# Patient Record
Sex: Male | Born: 1960 | Race: White | Hispanic: No | Marital: Married | Smoking: Never smoker
Health system: Southern US, Community
[De-identification: ages and names within clinical notes are randomized; demographics above are authoritative.]

## PROBLEM LIST (undated history)

## (undated) DIAGNOSIS — E119 Type 2 diabetes mellitus without complications: Secondary | ICD-10-CM

## (undated) DIAGNOSIS — I10 Essential (primary) hypertension: Secondary | ICD-10-CM

## (undated) DIAGNOSIS — I2510 Atherosclerotic heart disease of native coronary artery without angina pectoris: Secondary | ICD-10-CM

## (undated) HISTORY — PX: SPINAL FUSION: SHX223

## (undated) HISTORY — PX: CORONARY ARTERY BYPASS GRAFT: SHX141

---

## 2008-02-08 ENCOUNTER — Encounter: Admission: RE | Admit: 2008-02-08 | Discharge: 2008-02-08 | Payer: Self-pay | Admitting: Orthopedic Surgery

## 2008-03-17 ENCOUNTER — Ambulatory Visit (HOSPITAL_COMMUNITY): Admission: RE | Admit: 2008-03-17 | Discharge: 2008-03-18 | Payer: Self-pay | Admitting: Orthopedic Surgery

## 2008-07-27 ENCOUNTER — Inpatient Hospital Stay (HOSPITAL_COMMUNITY): Admission: RE | Admit: 2008-07-27 | Discharge: 2008-08-01 | Payer: Self-pay | Admitting: Orthopedic Surgery

## 2008-07-28 ENCOUNTER — Encounter (INDEPENDENT_AMBULATORY_CARE_PROVIDER_SITE_OTHER): Payer: Self-pay | Admitting: Orthopedic Surgery

## 2009-10-30 IMAGING — CR DG OR PORTABLE SPINE
1 series · 1 of 1 positions shown · non-contrast
Comparison: Preoperative lumbar radiographs 07/20/2008 and earlier.

CLINICAL DATA: 48-year-old male undergoing lumbar spine surgery.

PORTABLE SPINE

[view not recorded]
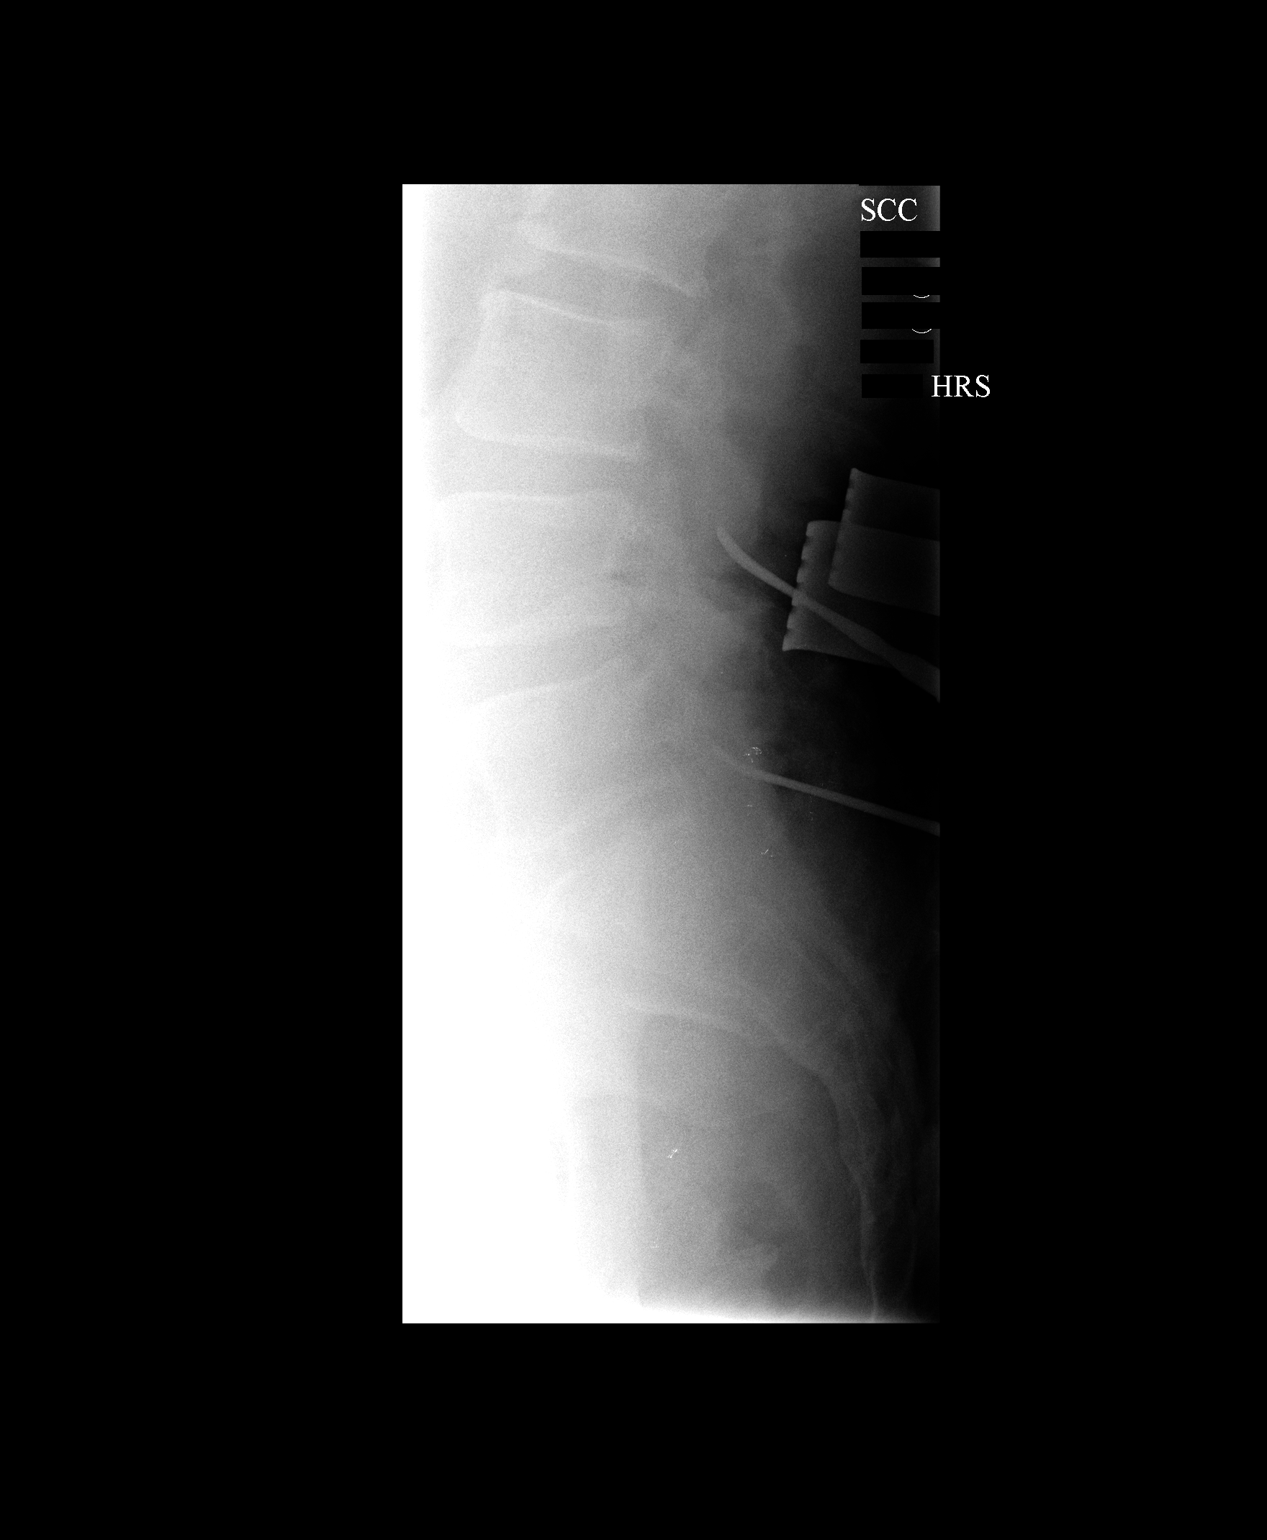

[1 of 1 positions shown; findings below may reference images not displayed]

FINDINGS: Portable cross-table lateral intraoperative view of the
lumbar spine labeled #1 at 8726 hours.

Using the same numbering scheme as on the preoperative exam, two
surgical markers are in place and the more caudal projects at the
L5-S1 disc space level while the more cephalad projects at the L3-
L4 interspace.
IMPRESSION: Intraoperative localization as above.

## 2010-01-15 ENCOUNTER — Ambulatory Visit: Payer: Self-pay | Admitting: Diagnostic Radiology

## 2010-01-15 ENCOUNTER — Emergency Department (HOSPITAL_BASED_OUTPATIENT_CLINIC_OR_DEPARTMENT_OTHER): Admission: EM | Admit: 2010-01-15 | Discharge: 2010-01-15 | Payer: Self-pay | Admitting: Emergency Medicine

## 2010-07-18 LAB — GLUCOSE, CAPILLARY
Glucose-Capillary: 109 mg/dL — ABNORMAL HIGH (ref 70–99)
Glucose-Capillary: 126 mg/dL — ABNORMAL HIGH (ref 70–99)
Glucose-Capillary: 126 mg/dL — ABNORMAL HIGH (ref 70–99)
Glucose-Capillary: 149 mg/dL — ABNORMAL HIGH (ref 70–99)
Glucose-Capillary: 166 mg/dL — ABNORMAL HIGH (ref 70–99)
Glucose-Capillary: 168 mg/dL — ABNORMAL HIGH (ref 70–99)
Glucose-Capillary: 173 mg/dL — ABNORMAL HIGH (ref 70–99)
Glucose-Capillary: 185 mg/dL — ABNORMAL HIGH (ref 70–99)
Glucose-Capillary: 193 mg/dL — ABNORMAL HIGH (ref 70–99)
Glucose-Capillary: 194 mg/dL — ABNORMAL HIGH (ref 70–99)

## 2010-07-18 LAB — CBC
HCT: 32.4 % — ABNORMAL LOW (ref 39.0–52.0)
HCT: 34.2 % — ABNORMAL LOW (ref 39.0–52.0)
HCT: 47.3 % (ref 39.0–52.0)
MCHC: 34.3 g/dL (ref 30.0–36.0)
MCHC: 32.6 g/dL (ref 30.0–36.0)
MCV: 86.6 fL (ref 78.0–100.0)
MCV: 86.7 fL (ref 78.0–100.0)
MCV: 87.3 fL (ref 78.0–100.0)
MCV: 87.7 fL (ref 78.0–100.0)
Platelets: 224 10*3/uL (ref 150–400)
Platelets: 239 10*3/uL (ref 150–400)
Platelets: 242 10*3/uL (ref 150–400)
Platelets: 318 10*3/uL (ref 150–400)
RDW: 13.4 % (ref 11.5–15.5)
RDW: 13.8 % (ref 11.5–15.5)
RDW: 13.6 % (ref 11.5–15.5)
WBC: 10.1 10*3/uL (ref 4.0–10.5)
WBC: 12.3 10*3/uL — ABNORMAL HIGH (ref 4.0–10.5)

## 2010-07-18 LAB — LIPID PANEL
HDL: 29 mg/dL — ABNORMAL LOW (ref >39)
Triglycerides: 103 mg/dL (ref <150)

## 2010-07-18 LAB — COMPREHENSIVE METABOLIC PANEL
AST: 36 U/L (ref 0–37)
BUN: 11 mg/dL (ref 6–23)
CO2: 26 mEq/L (ref 19–32)
Chloride: 102 mEq/L (ref 96–112)
Creatinine, Ser: 0.91 mg/dL (ref 0.4–1.5)
GFR calc non Af Amer: >60 mL/min (ref >60)
Glucose, Bld: 194 mg/dL — ABNORMAL HIGH (ref 70–99)
Total Bilirubin: 0.8 mg/dL (ref 0.3–1.2)

## 2010-07-18 LAB — CARDIAC PANEL(CRET KIN+CKTOT+MB+TROPI)
Total CK: 618 U/L — ABNORMAL HIGH (ref 7–232)
Troponin I: 0.02 ng/mL (ref 0.00–0.06)

## 2010-07-18 LAB — TSH: TSH: 0.609 u[IU]/mL (ref 0.350–4.500)

## 2010-07-18 LAB — BASIC METABOLIC PANEL
BUN: 8 mg/dL (ref 6–23)
BUN: 8 mg/dL (ref 6–23)
BUN: 7 mg/dL (ref 6–23)
CO2: 29 mEq/L (ref 19–32)
Calcium: 8.1 mg/dL — ABNORMAL LOW (ref 8.4–10.5)
Chloride: 100 mEq/L (ref 96–112)
Chloride: 103 mEq/L (ref 96–112)
Chloride: 98 mEq/L (ref 96–112)
Chloride: 100 mEq/L (ref 96–112)
Creatinine, Ser: 0.87 mg/dL (ref 0.4–1.5)
GFR calc Af Amer: >60 mL/min (ref >60)
Glucose, Bld: 160 mg/dL — ABNORMAL HIGH (ref 70–99)
Glucose, Bld: 196 mg/dL — ABNORMAL HIGH (ref 70–99)
Potassium: 3.4 mEq/L — ABNORMAL LOW (ref 3.5–5.1)
Potassium: 3.7 mEq/L (ref 3.5–5.1)
Potassium: 4 mEq/L (ref 3.5–5.1)

## 2010-07-18 LAB — POCT I-STAT 4, (NA,K, GLUC, HGB,HCT)
Glucose, Bld: 163 mg/dL — ABNORMAL HIGH (ref 70–99)
HCT: 35 % — ABNORMAL LOW (ref 39.0–52.0)
Hemoglobin: 11.9 g/dL — ABNORMAL LOW (ref 13.0–17.0)
Sodium: 137 mEq/L (ref 135–145)

## 2010-07-18 LAB — TROPONIN I: Troponin I: <0.01 ng/mL (ref 0.00–0.06)

## 2010-07-18 LAB — MAGNESIUM: Magnesium: 1.5 mg/dL (ref 1.5–2.5)

## 2010-08-21 NOTE — Op Note (Signed)
[name], [name]              ACCOUNT NO.:  [id number]   MEDICAL RECORD NO.:  [id number]          PATIENT TYPE:  AMB   LOCATION:  SDS                          FACILITY:  [facility]   PHYSICIAN:  Vania Rea. Supple, M.D.  DATE OF BIRTH:  04/24/1960   DATE OF PROCEDURE:  03/17/2008  DATE OF DISCHARGE:                               OPERATIVE REPORT   PREOPERATIVE DIAGNOSIS:  Right knee medial meniscus tear.   POSTOPERATIVE DIAGNOSIS:  Right knee chondromalacia and extensive  synovitis.   PROCEDURES:  1. Right knee diagnostic arthroscopy.  2. Extensive synovectomy.  3. Chondroplasty of the medial femoral condyle.   SURGEON:  Vania Rea. Supple, MD   ASSISTANT:  Lucita Lora. Shuford PA-C   ANESTHESIA:  General endotracheal as well as local anesthetic.   TOURNIQUET TIME:  None was used.   ESTIMATED BLOOD LOSS:  Minimal.   DRAINS:  None.   HISTORY:  [name] is a 50 year old gentleman who has had persistent  right knee pain and symptoms that have been refractory to prolonged  attempts at conservative management.  Due to his ongoing pain and  functional limitations, he is brought to the operating room at this time  for planned right knee arthroscopy as described below.   Preoperatively, I counseled [name] on treatment options as well as  risks versus benefits thereof.  Possible surgical complications of  bleeding, infection, neurovascular injury, DVT, PE, persistent pain, and  possible need for additional surgery were reviewed.  He understands and  accepts and agrees with our planned procedure.   PROCEDURE IN DETAIL:  After undergoing routine preop evaluation, the  patient received prophylactic antibiotics.  Brought to the operating  room, placed supine on the operating table, and underwent induction of  general endotracheal anesthesia.  Of note, he did have a difficult  airway.  GlideScope was utilized.  Right lower extremity was placed in  a leg holder and sterilely prepped and  draped in standard fashion.  Standard arthroscopy portals were established and diagnostic arthroscopy  was performed.  The suprapatellar pouch was clear as were the gutters.  There was significant proliferative overgrowth of the anterior chamber  synovial tissues encroaching upon the patellofemoral joint and anterior  chamber and extensive synovectomy was performed.  There was a nodule of  scarified tissue in the intercondylar notch which was pedunculated and  this was also removed with a shaver.  I suspect this was a remnant of  the ligamentum mucosum.  The ACL was intact.  The patellofemoral joint  showed normal patellar tracking and overall normal articular cartilage  with some softening of the superficial layers noted upon probing.  In  the medial compartment, there were several areas of blistering of the  superficial chondral layers and on probing, these were actually  significant chondral defects which were debrided and consistent with  grade 1 chondromalacia.  We then carefully inspected and probed the  medial meniscus and found it to be stable and did not identify any  obvious displacement tears.  In the lateral compartment, the articular  surfaces appeared to be in overall  good condition and the meniscus was  carefully probed and stable and found to be intact.  At this point, we  once again reevaluated the medial compartment, the medial meniscus, and  the additional intra-articular structures and did not identify any  additional articular pathologies.  Fluid and instruments were then  removed.  A combination of Marcaine, morphine, epinephrine, and  clonidine was instilled into the knee joint.  Additional Marcaine with  epinephrine instilled about the portals.  The portals were closed with  Steri-Strips.  A bulky dry dressing was then taped about the right knee  and the right leg was wrapped in Ace bandages, and thigh-high support  stocking.  The patient was then awakened,  extubated, and taken to the  recovery room in stable condition.      Vania Rea. Supple, M.D.  Electronically Signed     KMS/MEDQ  D:  03/17/2008  T:  03/18/2008  Job:  [id number]

## 2010-08-21 NOTE — Consult Note (Signed)
[name], [name]              ACCOUNT NO.:  [id number]   MEDICAL RECORD NO.:  [id number]          PATIENT TYPE:  INP   LOCATION:  5502                         FACILITY:  [facility]   PHYSICIAN:  Theodosia Paling, MD    DATE OF BIRTH:  [birth year]   DATE OF CONSULTATION:  07/29/2008  DATE OF DISCHARGE:                                 CONSULTATION   REASON FOR CONSULTATION:  Management of diabetes.   ADMISSION HISTORY:  [name] is a pleasant 50 year-old  gentleman who was admitted on July 27, 2008 for lumbar spine  decompression.  Meanwhile he developed high degree heart block and  cardiology consult was carried out.  He is noticed to have hyperglycemia  through this admission.  His home insulin regimen has not been with him  during  inpatient.  [facility] hospitalist is contacted for further  evaluation and management.   REVIEW OF SYSTEMS:  Essentially negative.  The patient feels he may have  some low grade fever.  Other than that, essentially negative.   PAST MEDICAL HISTORY:  1. Diabetes mellitus.  2. Obstructive sleep apnea with CPAP.  3. Impaired hearing with tinnitus.  4. Hyperlipidemia.  5. GERD.  6. Type 2 diabetes mellitus.  7. Kidney stones.  8. Hypertension.  9. High degree heart block discovered on this admission.   MEDICATIONS:  1. Pravastatin 40 mg p.o. daily.  2. Metformin 1000 mg p.o. q.12h.  3. Hydrochlorothiazide.  4. Hydrocodone/ acetaminophen 5/325 mg p.o. q.6h p.r.n.  5. Levemir 100 150 units subcu q.12h.  6. Diovan HCTZ 160 / 25 mg p.o. daily.  7. Aspirin 81 mg p.o. daily.   ALLERGIES:  QUESTIONABLE ALLERGY TO PENICILLIN.   SOCIAL HISTORY:  He lives with wife.  He denies  tobacco, alcohol and IV  drug abuse.   FAMILY HISTORY:  Mother had diabetes.  Father has diabetes.  CABG in his  1s.  His siblings are healthy.   CURRENT INPATIENT MEDICATIONS:  1. Aspirin enteric coated 81 mg p.o. daily.  2. Dulcolax 10 mg p.o. q.h.s. p.r.n.  3.  Colace 100 mg p.o. q. 12 h.  4. Sliding scale insulin.  5. Tylenol 650 mg p.o. q.4-6 h p.r.n..  6. Zofran  IV q.4h p.r.n.  7. Robaxin 500 mg p.o. q.8h p.r.n..  8. Percocet one to two tablets p.o. q.6h p.r.n..  9. Mechanical DVT prophylaxis.   PHYSICAL EXAMINATION:  VITAL SIGNS:  Blood pressure 132/75, temperature  100.5, heart rate 104, respiratory rate 22, O2 saturation 95% on room  air.  GENERAL:  In no acute cardiorespiratory distress.  LUNGS:  Normal breath sounds.  No rhonchi or crepitations.  CARDIOVASCULAR:  S1 and S2 normal.  No murmurs or rubs heard.  GASTROINTESTINAL:  Soft, nontender, obese.  EXTREMITIES:  No pedal edema.  NEUROLOGIC:  Speech intact.  Follows commands.  PSYCHIATRIC:  Oriented x 3.   LAB DATA:  No lab data is obtained today.  We have lab from yesterday  which is WBC 12.3, hemoglobin 11.3, hematocrit 32.7, platelets 224,  sodium 136, potassium 4.5, chloride 103, bicarb 27, BUN  13, creatinine  0.87.  platelet count 202.  Fasting blood glucose 173.   ASSESSMENT AND PLAN:  1. Diabetes, hyperglycemia.  The patient will need longacting insulin      to obviously cover fasting hypoglycemia. I am not sure about the      high dose of Levemir he was taking.  At this time, his fasting      hypoglycemia is not extremely severe. I would cautiously start him      on Lantus 25 units subcu while he is on carb modified diet along      with Humulin insulin which will be at least 8 units before each      meal.  Would titrate up the insulin as required and resume      metformin at this time.  He does not have renal insufficiency or      possible catheterization coming up.  2. Fever.  The patient has low grade fever at this time.  I would not      do anything however if he has persistent fever, a chest x-ray and      urinalysis can be performed to look for the cause of the fever and      possible site would be the recent surgical site to insure there is      no abscess  forming or site of infection at the surgical site.  3. Prophylaxis.  Given his back surgery, heparin and Lovenox are      contraindicated.  Continue SCDs and TEDs.  I would add Protonix for      GI prophylaxis.  4. Code status:  The patient is full code.   Thank you for this consultation.      Theodosia Paling, MD  Electronically Signed     NP/MEDQ  D:  07/29/2008  T:  07/29/2008  Job:  [id number]

## 2010-08-21 NOTE — Op Note (Signed)
NAMETREVIAN, HAYASHIDA              ACCOUNT NO.:  [id number]   MEDICAL RECORD NO.:  [id number]          PATIENT TYPE:  INP   LOCATION:  3310                         FACILITY:  [facility]   PHYSICIAN:  Alvy Beal, MD    DATE OF BIRTH:  July 28, 1960   DATE OF PROCEDURE:  07/27/2008  DATE OF DISCHARGE:                               OPERATIVE REPORT   PREOPERATIVE DIAGNOSIS:  Lumbar spinal stenosis.   POSTOPERATIVE DIAGNOSIS:  Lumbar spinal stenosis.   SURGEON:  Dahari D. Shon Baton, MD   FIRST ASSISTANT:  Crissie Reese, PA   OPERATIVE PROCEDURE:  Posterior lumbar decompression L3-L5 with L3-L4  and L4-L5 arthrodesis.   COMPLICATIONS:  None.   CONDITION:  Stable.   HISTORY:  This is a very pleasant 50 year old gentleman with multiple  medical comorbidities who presents with a history of lumbar spinal  stenosis with neurogenic claudication.  Attempts at conservative  management have failed to alleviate his pain.  These included injection  therapy, physiotherapy, narcotic and nonnarcotic medications, and  activity modification.  After failing conservative management, we  discussed surgical intervention.  All appropriate risks, benefits, and  alternatives were discussed with the patient and consent was obtained.   OPERATIVE NOTE:  The patient was brought to the operating room and  placed supine on the operating table.  After successful induction of  general anesthesia and endotracheal intubation, TEDs, SCDs, and Foley  were applied.  He was then turned prone onto the [place] frame.  Because  of the previous right rotator cuff surgery, his right shoulder was very  stiff and so we elected to keep this at the patient's side.  The left  was placed overhead and well-padded.  All bony prominences were well-  padded.  Then the back was prepped and draped in standard fashion.  A  midline incision was taken and a sharp dissection was carried out down  to deep fascia.  The deep fascia was  sharply incised and exposed from  the superior portion of L3 to the superior aspect of S1.  Once I was  through this deep fascia, I used a Cobb elevator to strip the muscles  off the erector spinae muscles to expose the lamina and spinous process  of L3, L4, and L5.  I then exposed the L3-L4 and L4-L5 facet complexes  but care was taken to maintain the integrity of the facet capsule.   Once I had bilateral exposure, I placed a Penfield 4 underneath the L5  lamina and a Therapist, nutritional underneath the L3 lamina.  I then took an  intraoperative x-ray.  I confirmed the appropriate level.  Once  confirmed, I then began my decompression.  I then developed a plane  underneath the L5 lamina and using a combination of 3 and 4-mm  Kerrisons, I removed the entire lamina of L5.  I then developed a plane  between the ligamentum flavum and the underlying thecal sac.  I then  placed a neural patty in this layer to protect the thecal sac and then  using 2 and 3-mm Kerrison resected the entire ligamentum flavum  and  completed the L5 laminectomy.  I then proceeded in a similar fashion  performing central decompression until I was about half way and  performed a laminotomy in the inferior half of L3.  With the central  decompression complete from L3 to L5, I then proceeded into the lateral  gutter.   I resected the very thickened ligamentum flavum and significant facet  bone spurs to expose the lateral recess.  I decompressed all the way out  to the medial wall of the pedicle.  At this point, once I could see the  pedicle, I was able to visualize the exiting nerve root and was able to  perform a foraminotomy.  I then exposed the L5 pedicle and performed a  foraminotomy at that level as well.   At this point in time, I had an adequate lateral decompression from L3-  L5 as well as a central decompression.  I then went to the opposite side  of the table and then worked on the lateral gutter again using 2 and  3-  mm Kerrison in order to effect an adequate lateral recess decompression  and foraminotomy.   At this point in time, because of the excessive amount of bone, I took  in order to adequately decompress the spine.  I elected to proceed with  a uninstrumented arthrodesis.  At this point, I disrupted the L4-L5  facet complex and exposed the L3 transverse process, L4 transverse  process, and L5 transverse process.  I kept the L5-S1 facet complex and  the L2-L3 facet complex intact.  I then decorticated the transverse  process and lateral aspect of the facet complex from L3 down to L5 and  then packed the posterolateral gutter with bone that I harvested in the  deep fascia with interrupted #1 Vicryl sutures and then did a zero  running layer stitch and then a 2-0 interrupted subcutaneous and a 3-0  Monocryl for the skin.  Steri-Strips and dry dressing was applied and  the patient was extubated and transferred to the PACU without incident.  At the end of the case, all needle and sponge counts were correct.  There were no adverse intraoperative events.      Alvy Beal, MD  Electronically Signed     DDB/MEDQ  D:  07/27/2008  T:  07/28/2008  Job:  [id number]

## 2010-08-24 NOTE — Discharge Summary (Signed)
NAMEDONZELL, COLLER              ACCOUNT NO.:  [id number]   MEDICAL RECORD NO.:  [id number]          PATIENT TYPE:  INP   LOCATION:  5502                         FACILITY:  [facility]   PHYSICIAN:  Alvy Beal, MD    DATE OF BIRTH:  08-28-1960   DATE OF ADMISSION:  07/27/2008  DATE OF DISCHARGE:  08/01/2008                               DISCHARGE SUMMARY   ADMISSION DIAGNOSIS:  Lumbar spinal stenosis.   DISCHARGE DIAGNOSIS:  Lumbar spinal stenosis with atrioventricular  block, complete.   CONSULTATIONS OBTAINED:  1. Cardiology.  2. Internal Medicine.   PROCEDURE:  Posterior lumbar decompression, L3 through L5 with L3  through L5 uninstrumented arthrodesis.   BRIEF HISTORY:  Mr. Renteria is a very pleasant 50 year old gentleman with  multiple medical comorbidities, who presented to Dr. Shon Baton with a  history of a lumbar spinal stenosis with neurogenic claudication.  All  attempts at conservative management consisting of physical therapy,  injection therapy, narcotic and nonnarcotic medications had failed to  alleviate his pain.  After failing conservative management, Dr. Shon Baton  had a lengthy discussion with both the patient and his wife about  surgical intervention.  All appropriate risks, benefits, and  alternatives were discussed with the patient, and a consent was  obtained.   HOSPITAL COURSE:  The patient's hospital course was approximately 5 days  in length.  It was complicated by the fact that during the extubation,  it was noted that the patient had runs of both the second and third-  degree heart block.  Therefore, a Cardiology consultation was obtained,  and also postoperatively day #2, it was noted that the patient's  diabetes was not being well controlled.  Therefore, an Internal Medicine  consultation was obtained to help manage his diabetes while inpatient.  From a VAC standpoint, the patient's hospital course was very well.  Postoperative day #1, he was worked  well with physical therapy.  He  began to have regular bowel and bladder movements.  He was complaining  of good pain control.  His compartments were nontender and he had a good  output from his Hemovac; therefore, it was removed postoperative day #2,  the patient continued to make positive weight gains throughout his  hospital stay.  Again, on postoperative day #2, he was deemed stable to  be transferred from the ICU to telemetry.  Postoperative day #5, the  patient's diabetes was better under control.  His cardiac labs have  remained stable.  He continued to work well with physical therapy.  He  was ambulating greater than 200 feet with the use of a rolling walker.  His incision remained clean, dry, and intact with no evidence of  erythema or wound dehiscence.  Throughout his entire stay, he remained  afebrile throughout his entire stay with his vitals ranging from a T-max  on April 25 at 95 to an eventual temperature of 97.9 upon discharge.  The patient's labs also remained stable throughout his hospital stay.  His compartments were soft and nontender.  He was tolerating regular  diet, having regular bowel or bladder movements.  The patient had some  mild complaints of constipation.  Postoperative day #4, the patient was  cleared by both Internal Medicine and Cardiology for discharge to home.   DISPOSITION:  The patient is discharged to home with home health care.   DISCHARGE MEDICATIONS:  1. Pravastatin 40 mg daily.  2. Metformin 1000 mg p.o. b.i.d.  3. Lantus insulin 25 units subcu at night.  4. Diovan/hydrochlorothiazide 140/25.  5. Aspirin 81 mg daily.  6. Multivitamins daily.  7. Percocet 10/325 one tablet p.o. q.6 h. p.r.n. pain.  8. He is to discontinue the use of his Celebrex.  9. He is to discontinue the use of his hydrocodone.  10.The patient is to discontinue the use of his Levemir.   DISCHARGE INSTRUCTIONS:  The patient is instructed to follow up with Dr.  Shon Baton in  the office at 2 weeks after his surgical date for a wound  check and suture removal.  He was given a preprinted lumbar  decompression instructions that went over specifics on ambulation, wound  management, and when to call the office.  For example, the patient is to  walk every day.  He is not to lift anything over 6 pounds.  He can  resume light household activities in approximately 6 weeks.  In the  meantime, he is not to drive or engage in any other heavy household  activities.  He is allowed to shower on postoperative day #5.  He is to  call the office at [phone] if he notes any fevers or chills or redness  around the area, loss of bowel or bladder function or increasing pain.  The patient is also instructed to follow up with Dr. Tresa Endo with  [facility] on Aug 18, 2008, at 11:15.  the  patient is to call and schedule if he has any questions at [phone].  The patient is to re-follow back up with his primary care physician  regarding his diabetes.      Crissie Reese, PA      Alvy Beal, MD  Electronically Signed    AC/MEDQ  D:  10/03/2008  T:  10/04/2008  Job:  [id number]

## 2011-01-11 LAB — URINALYSIS, ROUTINE W REFLEX MICROSCOPIC
Glucose, UA: NEGATIVE mg/dL
Hgb urine dipstick: NEGATIVE
Ketones, ur: NEGATIVE mg/dL
Protein, ur: NEGATIVE mg/dL
pH: 7 (ref 5.0–8.0)

## 2011-01-11 LAB — COMPREHENSIVE METABOLIC PANEL
ALT: 35 U/L (ref 0–53)
AST: 29 U/L (ref 0–37)
Alkaline Phosphatase: 78 U/L (ref 39–117)
CO2: 30 mEq/L (ref 19–32)
Chloride: 102 mEq/L (ref 96–112)
GFR calc Af Amer: >60 mL/min (ref >60)
GFR calc non Af Amer: >60 mL/min (ref >60)
Glucose, Bld: 61 mg/dL — ABNORMAL LOW (ref 70–99)
Potassium: 4.4 mEq/L (ref 3.5–5.1)
Sodium: 140 mEq/L (ref 135–145)
Total Bilirubin: 0.8 mg/dL (ref 0.3–1.2)

## 2011-01-11 LAB — GLUCOSE, CAPILLARY
Glucose-Capillary: 106 mg/dL — ABNORMAL HIGH (ref 70–99)
Glucose-Capillary: 111 mg/dL — ABNORMAL HIGH (ref 70–99)
Glucose-Capillary: 117 mg/dL — ABNORMAL HIGH (ref 70–99)
Glucose-Capillary: 124 mg/dL — ABNORMAL HIGH (ref 70–99)
Glucose-Capillary: 184 mg/dL — ABNORMAL HIGH (ref 70–99)
Glucose-Capillary: 190 mg/dL — ABNORMAL HIGH (ref 70–99)

## 2011-01-11 LAB — CBC
Hemoglobin: 15.2 g/dL (ref 13.0–17.0)
MCHC: 34.2 g/dL (ref 30.0–36.0)
RBC: 5.14 MIL/uL (ref 4.22–5.81)
WBC: 12 10*3/uL — ABNORMAL HIGH (ref 4.0–10.5)

## 2011-01-11 LAB — PROTIME-INR: Prothrombin Time: 13.4 seconds (ref 11.6–15.2)

## 2022-08-02 ENCOUNTER — Emergency Department (HOSPITAL_COMMUNITY)

## 2022-08-02 ENCOUNTER — Encounter (HOSPITAL_COMMUNITY): Payer: Self-pay | Admitting: Emergency Medicine

## 2022-08-02 ENCOUNTER — Emergency Department (HOSPITAL_COMMUNITY)
Admission: EM | Admit: 2022-08-02 | Discharge: 2022-08-02 | Disposition: A | Discharge status: Home / Self Care | Attending: Emergency Medicine | Admitting: Emergency Medicine

## 2022-08-02 ENCOUNTER — Other Ambulatory Visit: Payer: Self-pay

## 2022-08-02 DIAGNOSIS — W1789XA Other fall from one level to another, initial encounter: Secondary | ICD-10-CM | POA: Insufficient documentation

## 2022-08-02 DIAGNOSIS — S0083XA Contusion of other part of head, initial encounter: Secondary | ICD-10-CM | POA: Insufficient documentation

## 2022-08-02 DIAGNOSIS — Y990 Civilian activity done for income or pay: Secondary | ICD-10-CM | POA: Insufficient documentation

## 2022-08-02 DIAGNOSIS — Z951 Presence of aortocoronary bypass graft: Secondary | ICD-10-CM | POA: Diagnosis not present

## 2022-08-02 DIAGNOSIS — I2510 Atherosclerotic heart disease of native coronary artery without angina pectoris: Secondary | ICD-10-CM | POA: Insufficient documentation

## 2022-08-02 DIAGNOSIS — Y9269 Other specified industrial and construction area as the place of occurrence of the external cause: Secondary | ICD-10-CM | POA: Insufficient documentation

## 2022-08-02 DIAGNOSIS — S0990XA Unspecified injury of head, initial encounter: Secondary | ICD-10-CM | POA: Diagnosis present

## 2022-08-02 DIAGNOSIS — I10 Essential (primary) hypertension: Secondary | ICD-10-CM | POA: Insufficient documentation

## 2022-08-02 DIAGNOSIS — Z23 Encounter for immunization: Secondary | ICD-10-CM | POA: Insufficient documentation

## 2022-08-02 DIAGNOSIS — M545 Low back pain, unspecified: Secondary | ICD-10-CM | POA: Insufficient documentation

## 2022-08-02 DIAGNOSIS — E119 Type 2 diabetes mellitus without complications: Secondary | ICD-10-CM | POA: Diagnosis not present

## 2022-08-02 DIAGNOSIS — R109 Unspecified abdominal pain: Secondary | ICD-10-CM | POA: Diagnosis not present

## 2022-08-02 DIAGNOSIS — Z7982 Long term (current) use of aspirin: Secondary | ICD-10-CM | POA: Insufficient documentation

## 2022-08-02 DIAGNOSIS — S060X1A Concussion with loss of consciousness of 30 minutes or less, initial encounter: Secondary | ICD-10-CM | POA: Insufficient documentation

## 2022-08-02 DIAGNOSIS — W19XXXA Unspecified fall, initial encounter: Secondary | ICD-10-CM

## 2022-08-02 HISTORY — DX: Atherosclerotic heart disease of native coronary artery without angina pectoris: I25.10

## 2022-08-02 HISTORY — DX: Essential (primary) hypertension: I10

## 2022-08-02 HISTORY — DX: Type 2 diabetes mellitus without complications: E11.9

## 2022-08-02 LAB — I-STAT CHEM 8, ED
BUN: 15 mg/dL (ref 8–23)
BUN: 15 mg/dL (ref 8–23)
Calcium, Ion: 1 mmol/L — ABNORMAL LOW (ref 1.15–1.40)
Calcium, Ion: 1.02 mmol/L — ABNORMAL LOW (ref 1.15–1.40)
Chloride: 100 mmol/L (ref 98–111)
Chloride: 101 mmol/L (ref 98–111)
Creatinine, Ser: 0.6 mg/dL — ABNORMAL LOW (ref 0.61–1.24)
Creatinine, Ser: 0.6 mg/dL — ABNORMAL LOW (ref 0.61–1.24)
Glucose, Bld: 170 mg/dL — ABNORMAL HIGH (ref 70–99)
Glucose, Bld: 173 mg/dL — ABNORMAL HIGH (ref 70–99)
HCT: 42 % (ref 39.0–52.0)
HCT: 42 % (ref 39.0–52.0)
Hemoglobin: 14.3 g/dL (ref 13.0–17.0)
Hemoglobin: 14.3 g/dL (ref 13.0–17.0)
Potassium: 4.6 mmol/L (ref 3.5–5.1)
Potassium: 4.6 mmol/L (ref 3.5–5.1)
Sodium: 136 mmol/L (ref 135–145)
Sodium: 135 mmol/L (ref 135–145)
TCO2: 28 mmol/L (ref 22–32)
TCO2: 27 mmol/L (ref 22–32)

## 2022-08-02 LAB — COMPREHENSIVE METABOLIC PANEL
ALT: 20 U/L (ref 0–44)
AST: 25 U/L (ref 15–41)
Albumin: 3.7 g/dL (ref 3.5–5.0)
Alkaline Phosphatase: 73 U/L (ref 38–126)
Anion gap: 14 (ref 5–15)
BUN: 13 mg/dL (ref 8–23)
CO2: 24 mmol/L (ref 22–32)
Calcium: 8.9 mg/dL (ref 8.9–10.3)
Chloride: 97 mmol/L — ABNORMAL LOW (ref 98–111)
Creatinine, Ser: 0.73 mg/dL (ref 0.61–1.24)
GFR, Estimated: >60 mL/min (ref >60)
Glucose, Bld: 169 mg/dL — ABNORMAL HIGH (ref 70–99)
Potassium: 4.5 mmol/L (ref 3.5–5.1)
Sodium: 135 mmol/L (ref 135–145)
Total Bilirubin: 0.8 mg/dL (ref 0.3–1.2)
Total Protein: 6.7 g/dL (ref 6.5–8.1)

## 2022-08-02 LAB — CBC
HCT: 41.3 % (ref 39.0–52.0)
Hemoglobin: 14.1 g/dL (ref 13.0–17.0)
MCH: 29.8 pg (ref 26.0–34.0)
MCHC: 34.1 g/dL (ref 30.0–36.0)
MCV: 87.3 fL (ref 80.0–100.0)
Platelets: 226 10*3/uL (ref 150–400)
RBC: 4.73 MIL/uL (ref 4.22–5.81)
RDW: 14.1 % (ref 11.5–15.5)
WBC: 8.8 10*3/uL (ref 4.0–10.5)
nRBC: 0 % (ref 0.0–0.2)

## 2022-08-02 LAB — CBG MONITORING, ED: Glucose-Capillary: 170 mg/dL — ABNORMAL HIGH (ref 70–99)

## 2022-08-02 LAB — PROTIME-INR
INR: 1.2 (ref 0.8–1.2)
Prothrombin Time: 14.7 seconds (ref 11.4–15.2)

## 2022-08-02 LAB — LACTIC ACID, PLASMA: Lactic Acid, Venous: 2.4 mmol/L (ref 0.5–1.9)

## 2022-08-02 LAB — ETHANOL: Alcohol, Ethyl (B): <10 mg/dL (ref <10)

## 2022-08-02 LAB — TROPONIN I (HIGH SENSITIVITY)
Troponin I (High Sensitivity): 3 ng/L (ref <18)
Troponin I (High Sensitivity): 5 ng/L (ref <18)

## 2022-08-02 LAB — SAMPLE TO BLOOD BANK

## 2022-08-02 MED ORDER — IOHEXOL 350 MG/ML SOLN
75.0000 mL | Freq: Once | INTRAVENOUS | Status: AC | PRN
  Administered 2022-08-02: 75 mL via INTRAVENOUS

## 2022-08-02 MED ORDER — MORPHINE SULFATE (PF) 4 MG/ML IV SOLN
4.0000 mg | Freq: Once | INTRAVENOUS | Status: AC
  Administered 2022-08-02: 4 mg via INTRAVENOUS
  Filled 2022-08-02: qty 1

## 2022-08-02 MED ORDER — ACETAMINOPHEN 500 MG PO TABS
1000.0000 mg | ORAL_TABLET | ORAL | Status: AC
  Administered 2022-08-02: 1000 mg via ORAL
  Filled 2022-08-02: qty 2

## 2022-08-02 MED ORDER — ONDANSETRON HCL 4 MG/2ML IJ SOLN
4.0000 mg | Freq: Once | INTRAMUSCULAR | Status: AC
  Administered 2022-08-02: 4 mg via INTRAVENOUS
  Filled 2022-08-02: qty 2

## 2022-08-02 MED ORDER — TETANUS-DIPHTH-ACELL PERTUSSIS 5-2.5-18.5 LF-MCG/0.5 IM SUSY
0.5000 mL | PREFILLED_SYRINGE | Freq: Once | INTRAMUSCULAR | Status: AC
  Administered 2022-08-02: 0.5 mL via INTRAMUSCULAR
  Filled 2022-08-02: qty 0.5

## 2022-08-02 MED ORDER — LIDOCAINE 5 % EX PTCH
1.0000 | MEDICATED_PATCH | CUTANEOUS | Status: DC
  Administered 2022-08-02: 1 via TRANSDERMAL
  Filled 2022-08-02: qty 1

## 2022-08-02 NOTE — ED Triage Notes (Signed)
Trauma Response Nurse Documentation   [name] is a 62 y.o. male arriving to [facility] ED via [facility]  On No antithrombotic. Trauma was activated as a Level 2 by charge based on the following trauma criteria GCS 10-14 associated with trauma or AVPU < A.  Patient cleared for CT by Dr. Eloise Harman. Pt transported to CT with trauma response nurse present to monitor. RN remained with the patient throughout their absence from the department for clinical observation.   GCS 14.  History   Past Medical History:  Diagnosis Date   Coronary artery disease    Diabetes mellitus without complication (HCC)    Hypertension      Past Surgical History:  Procedure Laterality Date   CORONARY ARTERY BYPASS GRAFT     SPINAL FUSION         Initial Focused Assessment (If applicable, or please see trauma documentation):  Airway - clear Breathing - Unlabored Circulation - no external hemorrhage  GCS - 14 initially -- still having repetitive questioning  CT's Completed:   CT Head, CT Maxillofacial, CT C-Spine, CT Chest w/ contrast, and CT abdomen/pelvis w/ contrast   Interventions:  Labs Xrays CT  Pain meds  Plan for disposition:  Discharge home     Event Summary: Pt was on a crane at work , unsure what happened, fell face first onto a pile of lumber, did not try to brace himself. Abrasions to nose/forehead/scattered abrasions to legs, does have repetitive questioning.  MAE x 4, no numbness/tingling to extremities. C/o severe low back pain. Hx of multiple neck and back surgeries. Hx CABG --  Denies any chest pain. Does have LLQ pain, radiates to back with palpation.     Bedside handoff with ED RN Alease Medina  Trauma Response RN  Please call TRN at [phone] for further assistance.

## 2022-08-02 NOTE — Progress Notes (Signed)
Orthopedic Tech Progress Note Patient Details:  [name] Jun 04, 1960 [id number] Level 2 Trauma. Not needed Patient ID: [name], male   DOB: January 15, 1961, 62 y.o.   MRN: [id number]  Lovett Calender 08/02/2022, 8:12 AM

## 2022-08-02 NOTE — ED Provider Notes (Signed)
[name] EMERGENCY DEPARTMENT AT [facility] Provider Note   CSN: [id number] Arrival date & time: 08/02/22  4098     History  Chief Complaint  Patient presents with   [name] is a 62 y.o. male.  62 year old male with history of CAD status post CABG on aspirin, HTN, and DM who presents emergency department after a fall.  Patient was working in a [employer] site when he fell approximately 5 feet into a pile of wood.  Did strike his face and had some bleeding from his nose.  Also complaining of back pain after the fall.  Not on any other blood thinners aside from aspirin.  No vision changes, weakness or numbness of his arms or legs.  Did not recall exactly how he actually fell.  Unsure of last tetanus shot.  Was reportedly confused per EMS.       Home Medications Prior to Admission medications   Not on File      Allergies    Patient has no known allergies.    Review of Systems   Review of Systems  Physical Exam Updated Vital Signs BP 107/62   Pulse 94   Temp (!) 97.3 F (36.3 C) (Oral)   Resp 17   Ht (S) 5\' 11"  (1.803 m)   Wt (S) 97.5 kg   SpO2 98%   BMI 29.99 kg/m  Physical Exam Constitutional:      General: He is not in acute distress.    Appearance: Normal appearance. He is not ill-appearing.  HENT:     Head: Normocephalic.     Comments: Bleeding from nasal bridge.  Blood from bilateral nares.  No apparent nasal septal hematoma.    Right Ear: External ear normal.     Left Ear: External ear normal.     Mouth/Throat:     Mouth: Mucous membranes are moist.     Pharynx: Oropharynx is clear.     Comments: No apparent loose teeth Eyes:     Extraocular Movements: Extraocular movements intact.     Conjunctiva/sclera: Conjunctivae normal.     Pupils: Pupils are equal, round, and reactive to light.     Comments: Pupils 4 mm bilaterally  Neck:     Comments: C-collar in place Cardiovascular:     Rate and Rhythm: Normal rate and  regular rhythm.     Pulses: Normal pulses.     Heart sounds: Normal heart sounds.  Pulmonary:     Effort: Pulmonary effort is normal. No respiratory distress.     Breath sounds: Normal breath sounds.  Abdominal:     General: Abdomen is flat. Bowel sounds are normal. There is no distension.     Palpations: Abdomen is soft. There is no mass.     Tenderness: There is abdominal tenderness (Left mid abdomen). There is no guarding.  Genitourinary:    Comments: Good rectal tone.  Chaperoned by patient's RN Musculoskeletal:        General: No deformity. Normal range of motion.     Comments: Tenderness to palpation of L3 spine without step-offs noted.  Scars from cervical spinal fusion and lumbar spinal fusion  No tenderness to palpation of chest wall.  No bruising noted.  No tenderness to palpation of bilateral clavicles.  No tenderness to palpation, bruising, or deformities noted of bilateral shoulders, elbows, wrists, hips, knees, or ankles.  Neurological:     General: No focal deficit present.     Mental Status: He  is alert and oriented to person, place, and time. Mental status is at baseline.     Cranial Nerves: No cranial nerve deficit.     Sensory: No sensory deficit.     Motor: No weakness.     ED Results / Procedures / Treatments   Labs (all labs ordered are listed, but only abnormal results are displayed) Labs Reviewed  COMPREHENSIVE METABOLIC PANEL - Abnormal; Notable for the following components:      Result Value   Chloride 97 (*)    Glucose, Bld 169 (*)    All other components within normal limits  LACTIC ACID, PLASMA - Abnormal; Notable for the following components:   Lactic Acid, Venous 2.4 (*)    All other components within normal limits  I-STAT CHEM 8, ED - Abnormal; Notable for the following components:   Creatinine, Ser 0.60 (*)    Glucose, Bld 170 (*)    Calcium, Ion 1.00 (*)    All other components within normal limits  CBG MONITORING, ED - Abnormal; Notable  for the following components:   Glucose-Capillary 170 (*)    All other components within normal limits  I-STAT CHEM 8, ED - Abnormal; Notable for the following components:   Creatinine, Ser 0.60 (*)    Glucose, Bld 173 (*)    Calcium, Ion 1.02 (*)    All other components within normal limits  CBC  ETHANOL  PROTIME-INR  SAMPLE TO BLOOD BANK  TROPONIN I (HIGH SENSITIVITY)  TROPONIN I (HIGH SENSITIVITY)    EKG EKG Interpretation  Date/Time:  Friday August 02 2022 08:22:13 EDT Ventricular Rate:  84 PR Interval:  207 QRS Duration: 87 QT Interval:  367 QTC Calculation: 434 R Axis:   -9 Text Interpretation: Sinus rhythm Low voltage, precordial leads Nonspecific T abnrm, anterolateral leads ST elevation, consider inferior injury Confirmed by Vonita Moss [phone]) on 08/02/2022 8:26:39 AM  Radiology CT MAXILLOFACIAL WO CONTRAST  Result Date: 08/02/2022 CLINICAL DATA:  Facial trauma, blunt EXAM: CT MAXILLOFACIAL WITHOUT CONTRAST TECHNIQUE: Multidetector CT imaging of the maxillofacial structures was performed. Multiplanar CT image reconstructions were also generated. RADIATION DOSE REDUCTION: This exam was performed according to the departmental dose-optimization program which includes automated exposure control, adjustment of the mA and/or kV according to patient size and/or use of iterative reconstruction technique. COMPARISON:  None Available. FINDINGS: Osseous: No fracture or mandibular dislocation. No destructive process. Orbits: Negative. No traumatic or inflammatory finding. Sinuses: No air-fluid levels. Rounded soft tissue in the maxillary sinuses bilaterally, likely mucous retention cyst. Mastoid air cells clear. Soft tissues: Soft tissue swelling over the left side of the forehead/supraorbital region. Limited intracranial: See head CT report IMPRESSION: No facial or orbital fracture. Electronically Signed   By: Charlett Nose M.D.   On: 08/02/2022 10:18   CT L-SPINE NO  CHARGE  Result Date: 08/02/2022 CLINICAL DATA:  back pain sp fall EXAM: CT Thoracic and Lumbar spine with contrast TECHNIQUE: Multiplanar CT images of the thoracic and lumbar spine were reconstructed from contemporary CT of the Chest, Abdomen, and Pelvis. RADIATION DOSE REDUCTION: This exam was performed according to the departmental dose-optimization program which includes automated exposure control, adjustment of the mA and/or kV according to patient size and/or use of iterative reconstruction technique. CONTRAST:  No additional contrast COMPARISON:  CT abdomen and pelvis today. FINDINGS: CT THORACIC SPINE FINDINGS Alignment: Normal Vertebrae: No acute fracture or focal pathologic process. Paraspinal and other soft tissues: Negative. Disc levels: Posterior fusion changes in the mid to  lower thoracic spine. No hardware complicating feature. No visible disc herniation. CT LUMBAR SPINE FINDINGS Segmentation: 5 lumbar type vertebrae. Alignment: Normal Vertebrae: No acute fracture or focal pathologic process. Postoperative changes from laminectomy at L4 and L5. Paraspinal and other soft tissues: Negative Disc levels: Flowing anterior and lateral osteophytes. No disc herniation. IMPRESSION: Postoperative and degenerative changes.  No acute bony abnormality. Electronically Signed   By: Charlett Nose M.D.   On: 08/02/2022 10:17   CT T-SPINE NO CHARGE  Result Date: 08/02/2022 CLINICAL DATA:  back pain sp fall EXAM: CT Thoracic and Lumbar spine with contrast TECHNIQUE: Multiplanar CT images of the thoracic and lumbar spine were reconstructed from contemporary CT of the Chest, Abdomen, and Pelvis. RADIATION DOSE REDUCTION: This exam was performed according to the departmental dose-optimization program which includes automated exposure control, adjustment of the mA and/or kV according to patient size and/or use of iterative reconstruction technique. CONTRAST:  No additional contrast COMPARISON:  CT abdomen and pelvis  today. FINDINGS: CT THORACIC SPINE FINDINGS Alignment: Normal Vertebrae: No acute fracture or focal pathologic process. Paraspinal and other soft tissues: Negative. Disc levels: Posterior fusion changes in the mid to lower thoracic spine. No hardware complicating feature. No visible disc herniation. CT LUMBAR SPINE FINDINGS Segmentation: 5 lumbar type vertebrae. Alignment: Normal Vertebrae: No acute fracture or focal pathologic process. Postoperative changes from laminectomy at L4 and L5. Paraspinal and other soft tissues: Negative Disc levels: Flowing anterior and lateral osteophytes. No disc herniation. IMPRESSION: Postoperative and degenerative changes.  No acute bony abnormality. Electronically Signed   By: Charlett Nose M.D.   On: 08/02/2022 10:17   CT CHEST ABDOMEN PELVIS W CONTRAST  Result Date: 08/02/2022 CLINICAL DATA:  Polytrauma, blunt.  Fall EXAM: CT CHEST, ABDOMEN, AND PELVIS WITH CONTRAST TECHNIQUE: Multidetector CT imaging of the chest, abdomen and pelvis was performed following the standard protocol during bolus administration of intravenous contrast. RADIATION DOSE REDUCTION: This exam was performed according to the departmental dose-optimization program which includes automated exposure control, adjustment of the mA and/or kV according to patient size and/or use of iterative reconstruction technique. CONTRAST:  75mL OMNIPAQUE IOHEXOL 350 MG/ML SOLN COMPARISON:  None Available. FINDINGS: CT CHEST FINDINGS Cardiovascular: Heart is normal size. Aorta is normal caliber. Coronary artery and aortic calcifications. Prior CABG. Mediastinum/Nodes: No mediastinal, hilar, or axillary adenopathy. Trachea and esophagus are unremarkable. Thyroid unremarkable. Lungs/Pleura: Lungs are clear. No focal airspace opacities or suspicious nodules. No effusions. No pneumothorax Musculoskeletal: Chest wall soft tissues are unremarkable. No acute bony abnormality. Posterior fusion changes in the mid to lower thoracic  spine. CT ABDOMEN PELVIS FINDINGS Hepatobiliary: No hepatic injury or perihepatic hematoma. Gallbladder is unremarkable. Pancreas: No focal abnormality or ductal dilatation. Spleen: No splenic injury or perisplenic hematoma. Adrenals/Urinary Tract: No adrenal hemorrhage or renal injury identified. Bladder is unremarkable. Stomach/Bowel: Stomach, large and small bowel grossly unremarkable. Vascular/Lymphatic: No evidence of aneurysm or adenopathy. Scattered aortic calcifications. Reproductive: No focal abnormality. Other: No free fluid or free air. Musculoskeletal: Diffuse degenerative changes in the lumbar spine. Postoperative changes in lower lumbar spine. No acute bony abnormality. IMPRESSION: No acute findings or significant traumatic injury in the chest, abdomen or pelvis. Coronary artery disease, aortic atherosclerosis.  Prior CABG. Electronically Signed   By: Charlett Nose M.D.   On: 08/02/2022 10:14   CT CERVICAL SPINE WO CONTRAST  Result Date: 08/02/2022 CLINICAL DATA:  Polytrauma, blunt EXAM: CT CERVICAL SPINE WITHOUT CONTRAST TECHNIQUE: Multidetector CT imaging of the cervical spine was performed  without intravenous contrast. Multiplanar CT image reconstructions were also generated. RADIATION DOSE REDUCTION: This exam was performed according to the departmental dose-optimization program which includes automated exposure control, adjustment of the mA and/or kV according to patient size and/or use of iterative reconstruction technique. COMPARISON:  None Available. FINDINGS: Alignment: Normal Skull base and vertebrae: No acute fracture. No primary bone lesion or focal pathologic process. Soft tissues and spinal canal: No prevertebral fluid or swelling. No visible canal hematoma. Disc levels: Posterior fusion changes from C3-T2. Diffuse degenerative disc disease with disc space narrowing and flowing anterior and posterior osteophytes. Upper chest: No acute findings Other: None IMPRESSION: Postoperative and  degenerative changes.  No acute bony abnormality. Electronically Signed   By: Charlett Nose M.D.   On: 08/02/2022 10:11   CT HEAD WO CONTRAST  Result Date: 08/02/2022 CLINICAL DATA:  Head trauma, moderate to severe. EXAM: CT HEAD WITHOUT CONTRAST TECHNIQUE: Contiguous axial images were obtained from the base of the skull through the vertex without intravenous contrast. RADIATION DOSE REDUCTION: This exam was performed according to the departmental dose-optimization program which includes automated exposure control, adjustment of the mA and/or kV according to patient size and/or use of iterative reconstruction technique. COMPARISON:  None Available. FINDINGS: Brain: No acute intracranial abnormality. Specifically, no hemorrhage, hydrocephalus, mass lesion, acute infarction, or significant intracranial injury. Vascular: No hyperdense vessel or unexpected calcification. Skull: No acute calvarial abnormality. Sinuses/Orbits: No acute findings Other: Soft tissue swelling in the left forehead. IMPRESSION: No acute intracranial abnormality. Electronically Signed   By: Charlett Nose M.D.   On: 08/02/2022 10:10   DG Pelvis Portable  Result Date: 08/02/2022 CLINICAL DATA:  Trauma, fell 5 feet, chronic back pain, multiple back surgeries EXAM: PORTABLE PELVIS 1-2 VIEWS COMPARISON:  Portable exam 0827 hours without priors for comparison FINDINGS: Prior laminectomies of L4 and L5. Hip and SI joint spaces preserved. Scattered enthesopathy in pelvis and proximal femora. No acute fracture, dislocation, or bone destruction. IMPRESSION: No acute osseous abnormalities. Electronically Signed   By: Ulyses Southward M.D.   On: 08/02/2022 08:37   DG Chest Port 1 View  Result Date: 08/02/2022 CLINICAL DATA:  Trauma.  Fall. EXAM: PORTABLE CHEST 1 VIEW COMPARISON:  Thoracic spine radiographs 01/15/2010. Two-view chest x-ray 07/16/2010 FINDINGS: Heart size is normal. Lung volumes are low. No focal airspace disease or pneumothorax is  present. Cervicothoracic and thoracolumbar posterior fusion noted. Bony thorax is otherwise unremarkable. IMPRESSION: 1. Low lung volumes. 2. No acute cardiopulmonary disease. Electronically Signed   By: Marin Roberts M.D.   On: 08/02/2022 08:37    Procedures Procedures   Medications Ordered in ED Medications  Tdap (BOOSTRIX) injection 0.5 mL (0.5 mLs Intramuscular Given 08/02/22 0837)  morphine (PF) 4 MG/ML injection 4 mg (4 mg Intravenous Given 08/02/22 0837)  ondansetron (ZOFRAN) injection 4 mg (4 mg Intravenous Given 08/02/22 0836)  iohexol (OMNIPAQUE) 350 MG/ML injection 75 mL (75 mLs Intravenous Contrast Given 08/02/22 0908)  morphine (PF) 4 MG/ML injection 4 mg (4 mg Intravenous Given 08/02/22 1040)  acetaminophen (TYLENOL) tablet 1,000 mg (1,000 mg Oral Given 08/02/22 1218)    ED Course/ Medical Decision Making/ A&P                             Medical Decision Making Amount and/or Complexity of Data Reviewed Labs: ordered. Radiology: ordered.  Risk OTC drugs. Prescription drug management.   Shiquan Mathieu is a 62 y.o. male with comorbidities  that complicate the patient evaluation including CAD status post CABG on aspirin, HTN, and DM who presents emergency department after a fall with facial trauma and back pain and abdominal tenderness to palpation  Initial Ddx:  TBI, C-spine injury, lumbar spine fracture, intra-abdominal injury, syncope  MDM:  Patient high risk for TBI given his aspirin use and mechanism.  Will also obtain CT of the C-spine as well as chest, abdomen, pelvis with his abdominal pain and back tenderness to palpation that could be representative of intra-abdominal injury or thoracic spine injury.  May have had a syncopal episode given his cardiac history.  Plan:  Labs Troponin Sample to blood bank EKG Chest x-ray and pelvis x-ray Trauma pan scan  ED Summary/Re-evaluation:  Patient reevaluated and was stable in the emergency department.  Had trauma  scans that did not reveal any acute abnormality.  EKG and serial troponins were unremarkable.  Feel the patient may have had a concussion with his loss of consciousness and difficulty remembering exactly what happened.  Will have him return to work gradually with concussion precautions and follow-up with his primary doctor in several days.  This patient presents to the ED for concern of complaints listed in HPI, this involves an extensive number of treatment options, and is a complaint that carries with it a high risk of complications and morbidity. Disposition including potential need for admission considered.   Dispo: DC Home. Return precautions discussed including, but not limited to, those listed in the AVS. Allowed pt time to ask questions which were answered fully prior to dc.  Additional history obtained from EMS Records reviewed Outpatient Clinic Notes The following labs were independently interpreted: Chemistry and show no acute abnormality I independently reviewed the following imaging with scope of interpretation limited to determining acute life threatening conditions related to emergency care: CT Head and agree with the radiologist interpretation with the following exceptions: none I personally reviewed and interpreted cardiac monitoring: normal sinus rhythm  I personally reviewed and interpreted the pt's EKG: see above for interpretation  I have reviewed the patients home medications and made adjustments as needed  Final Clinical Impression(s) / ED Diagnoses Final diagnoses:  Fall, initial encounter  Contusion of face, initial encounter  Concussion with loss of consciousness of 30 minutes or less, initial encounter  Acute midline low back pain without sciatica    Rx / DC Orders ED Discharge Orders     None         Rondel Baton, MD 08/02/22 1753

## 2022-08-02 NOTE — ED Notes (Signed)
[place] -- [name] [phone]

## 2022-08-02 NOTE — Discharge Instructions (Signed)
You were seen for your head injury in the emergency department. It is likely that you had a concussion.  At home, please use tylenol for any headaches that you may have. You may also use over the counter lidocaine patches for your back pain.   Use ice on your nose and face to limit swelling.  Return gradually to work and school over the next week. Take breaks if you are having headaches or trouble thinking clearly.   Follow-up with your primary doctor or concussion clinic in 1 week regarding your visit.  DO NOT return to playing sports or activities where you could sustain additional head trauma until cleared to do so by a healthcare provider.   Return immediately to the emergency department if you experience any of the following: severe headache, persistent vomiting, or any other concerning symptoms.    Thank you for visiting our Emergency Department. It was a pleasure taking care of you today.
